# Patient Record
Sex: Male | Born: 1995 | Race: White | Hispanic: No | Marital: Single | Smoking: Current some day smoker
Health system: Southern US, Community
[De-identification: ages and names within clinical notes are randomized; demographics above are authoritative.]

## PROBLEM LIST (undated history)

## (undated) DIAGNOSIS — F419 Anxiety disorder, unspecified: Secondary | ICD-10-CM

## (undated) DIAGNOSIS — F410 Panic disorder [episodic paroxysmal anxiety] without agoraphobia: Secondary | ICD-10-CM

---
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
## 2022-03-21 ENCOUNTER — Emergency Department (HOSPITAL_BASED_OUTPATIENT_CLINIC_OR_DEPARTMENT_OTHER)
Admission: EM | Admit: 2022-03-21 | Discharge: 2022-03-21 | Disposition: A | Discharge status: Home / Self Care | Payer: Self-pay | Attending: Emergency Medicine | Admitting: Emergency Medicine

## 2022-03-21 ENCOUNTER — Other Ambulatory Visit: Payer: Self-pay

## 2022-03-21 ENCOUNTER — Encounter (HOSPITAL_BASED_OUTPATIENT_CLINIC_OR_DEPARTMENT_OTHER): Payer: Self-pay

## 2022-03-21 ENCOUNTER — Emergency Department (HOSPITAL_BASED_OUTPATIENT_CLINIC_OR_DEPARTMENT_OTHER): Payer: Self-pay | Admitting: Radiology

## 2022-03-21 DIAGNOSIS — R002 Palpitations: Secondary | ICD-10-CM

## 2022-03-21 DIAGNOSIS — E876 Hypokalemia: Secondary | ICD-10-CM | POA: Insufficient documentation

## 2022-03-21 DIAGNOSIS — I493 Ventricular premature depolarization: Secondary | ICD-10-CM | POA: Insufficient documentation

## 2022-03-21 HISTORY — DX: Anxiety disorder, unspecified: F41.9

## 2022-03-21 HISTORY — DX: Panic disorder (episodic paroxysmal anxiety): F41.0

## 2022-03-21 LAB — CBC
HCT: 45 % (ref 39.0–52.0)
Hemoglobin: 15.9 g/dL (ref 13.0–17.0)
MCH: 29.2 pg (ref 26.0–34.0)
MCHC: 35.3 g/dL (ref 30.0–36.0)
MCV: 82.7 fL (ref 80.0–100.0)
Platelets: 214 10*3/uL (ref 150–400)
RBC: 5.44 MIL/uL (ref 4.22–5.81)
RDW: 12.5 % (ref 11.5–15.5)
WBC: 10.3 10*3/uL (ref 4.0–10.5)
nRBC: 0 % (ref 0.0–0.2)

## 2022-03-21 LAB — BASIC METABOLIC PANEL
Anion gap: 12 (ref 5–15)
BUN: 12 mg/dL (ref 6–20)
CO2: 25 mmol/L (ref 22–32)
Calcium: 10.1 mg/dL (ref 8.9–10.3)
Chloride: 102 mmol/L (ref 98–111)
Creatinine, Ser: 0.99 mg/dL (ref 0.61–1.24)
GFR, Estimated: >60 mL/min (ref >60)
Glucose, Bld: 108 mg/dL — ABNORMAL HIGH (ref 70–99)
Potassium: 3.1 mmol/L — ABNORMAL LOW (ref 3.5–5.1)
Sodium: 139 mmol/L (ref 135–145)

## 2022-03-21 LAB — TROPONIN I (HIGH SENSITIVITY): Troponin I (High Sensitivity): 12 ng/L (ref <18)

## 2022-03-21 MED ORDER — POTASSIUM CHLORIDE ER 10 MEQ PO TBCR: 10.0000 meq | EXTENDED_RELEASE_TABLET | Freq: Two times a day (BID) | ORAL | 0 refills | Status: DC

## 2022-03-21 MED ORDER — POTASSIUM CHLORIDE CRYS ER 20 MEQ PO TBCR
40.0000 meq | EXTENDED_RELEASE_TABLET | Freq: Once | ORAL | Status: AC
  Administered 2022-03-21: 40 meq via ORAL
  Filled 2022-03-21: qty 2

## 2022-03-21 NOTE — ED Provider Notes (Signed)
? ?[facility]  ?Provider Note ? ?CSN: [id number] ?Arrival date & time: 03/21/22 1928 ? ?History ?Chief Complaint  ?Patient presents with  ? Palpitations  ? ? ?Brian Yoder is a 26 y.o. male with no significant PMH reports 3 days of palpitations like his heart is skipping a beat. Happens every few minutes. He is concerned he is going to have a heart attack or his heart will stop. Some SOB with the palpitations, but otherwise he has been in his usual state of health.  ? ? ?Home Medications ?Prior to Admission medications   ?Medication Sig Start Date End Date Taking? Authorizing Provider  ?potassium chloride (KLOR-CON) 10 MEQ tablet Take 1 tablet (10 mEq total) by mouth 2 (two) times daily for 3 days. 03/21/22 03/24/22 Yes Pollyann Savoy, MD  ? ? ? ?Allergies    ?Patient has no known allergies. ? ? ?Review of Systems   ?Review of Systems ?Please see HPI for pertinent positives and negatives ? ?Physical Exam ?BP (!) 134/91   Pulse (!) 102   Temp 98.2 ?F (36.8 ?C)   Resp 15   Ht 5\' 7"  (1.702 m)   Wt 72.6 kg   SpO2 100%   BMI 25.06 kg/m?  ? ?Physical Exam ?Vitals and nursing note reviewed.  ?Constitutional:   ?   Appearance: Normal appearance.  ?HENT:  ?   Head: Normocephalic and atraumatic.  ?   Nose: Nose normal.  ?   Mouth/Throat:  ?   Mouth: Mucous membranes are moist.  ?Eyes:  ?   Extraocular Movements: Extraocular movements intact.  ?   Conjunctiva/sclera: Conjunctivae normal.  ?Cardiovascular:  ?   Rate and Rhythm: Normal rate.  ?   Comments: Occasional PVC on monitor ?Pulmonary:  ?   Effort: Pulmonary effort is normal.  ?   Breath sounds: Normal breath sounds.  ?Abdominal:  ?   General: Abdomen is flat.  ?   Palpations: Abdomen is soft.  ?   Tenderness: There is no abdominal tenderness.  ?Musculoskeletal:     ?   General: No swelling. Normal range of motion.  ?   Cervical back: Neck supple.  ?Skin: ?   General: Skin is warm and dry.  ?Neurological:  ?   General: No  focal deficit present.  ?   Mental Status: He is alert.  ?Psychiatric:     ?   Mood and Affect: Mood normal.  ? ? ?ED Results / Procedures / Treatments   ?EKG ?EKG Interpretation ? ?Date/Time:  Friday March 21 2022 19:39:43 EDT ?Ventricular Rate:  101 ?PR Interval:  180 ?QRS Duration: 92 ?QT Interval:  336 ?QTC Calculation: 435 ?R Axis:   88 ?Text Interpretation: Sinus tachycardia Otherwise normal ECG No previous ECGs available Confirmed by [id number] (Lorre Nick) on 03/21/2022 9:31:09 PM ? ?Procedures ?Procedures ? ?Medications Ordered in the ED ?Medications  ?potassium chloride SA (KLOR-CON M) CR tablet 40 mEq (40 mEq Oral Given 03/21/22 2315)  ? ? ?Initial Impression and Plan ? Patient here with symptoms consistent with occasional PVCs confirmed on monitor during evaluation. Labs done in triage show normal CBC and Trop. BMP with mild hypokalemia may be contributing. Will replete orally, recommend outpatient Cardiology follow up if symptoms persist. Patient has declined CXR due to cost concerns.  ? ?ED Course  ? ?  ? ? ?MDM Rules/Calculators/A&P ?Medical Decision Making ?Problems Addressed: ?Hypokalemia: acute illness or injury ?Palpitations: acute illness or injury ?PVCs (premature ventricular contractions): acute illness  or injury ? ?Amount and/or Complexity of Data Reviewed ?Labs: ordered. Decision-making details documented in ED Course. ?Radiology: ordered. ?ECG/medicine tests: ordered and independent interpretation performed. Decision-making details documented in ED Course. ? ?Risk ?Prescription drug management. ? ? ? ?Final Clinical Impression(s) / ED Diagnoses ?Final diagnoses:  ?Palpitations  ?PVCs (premature ventricular contractions)  ?Hypokalemia  ? ? ?Rx / DC Orders ?ED Discharge Orders   ? ?      Ordered  ?  Ambulatory referral to Cardiology       ? 03/21/22 2319  ?  potassium chloride (KLOR-CON) 10 MEQ tablet  2 times daily       ? 03/21/22 2320  ? ?  ?  ? ?  ? ?  ?Pollyann Savoy, MD ?03/21/22  2320 ? ?

## 2022-03-21 NOTE — ED Triage Notes (Signed)
Pt report rapid heart beat x3 days. Pt states it comes and goes.  ? ?Does delta-8 sometimes, last time earlier in the week. Hx of panic attacks. ?

## 2022-03-21 NOTE — Progress Notes (Signed)
Pt does not want xray until he sees a doctor first. States he does not think he needs it.  ?

## 2022-05-19 ENCOUNTER — Ambulatory Visit (HOSPITAL_BASED_OUTPATIENT_CLINIC_OR_DEPARTMENT_OTHER): Payer: Self-pay | Admitting: Cardiology

## 2022-05-26 ENCOUNTER — Ambulatory Visit
Admission: EM | Admit: 2022-05-26 | Discharge: 2022-05-26 | Disposition: A | Discharge status: Home / Self Care | Payer: Self-pay | Attending: Nurse Practitioner | Admitting: Nurse Practitioner

## 2022-05-26 ENCOUNTER — Ambulatory Visit (INDEPENDENT_AMBULATORY_CARE_PROVIDER_SITE_OTHER): Payer: Self-pay

## 2022-05-26 ENCOUNTER — Ambulatory Visit (INDEPENDENT_AMBULATORY_CARE_PROVIDER_SITE_OTHER): Admission: EM | Admit: 2022-05-26 | Payer: Self-pay | Source: Home / Self Care

## 2022-05-26 DIAGNOSIS — K5900 Constipation, unspecified: Secondary | ICD-10-CM

## 2022-05-26 DIAGNOSIS — R109 Unspecified abdominal pain: Secondary | ICD-10-CM

## 2022-05-26 LAB — POCT URINALYSIS DIP (MANUAL ENTRY)
Bilirubin, UA: NEGATIVE
Blood, UA: NEGATIVE
Glucose, UA: NEGATIVE mg/dL
Ketones, POC UA: NEGATIVE mg/dL
Leukocytes, UA: NEGATIVE
Nitrite, UA: NEGATIVE
Protein Ur, POC: NEGATIVE mg/dL
Spec Grav, UA: >=1.03 — AB (ref 1.010–1.025)
Urobilinogen, UA: 1 E.U./dL
pH, UA: 6 (ref 5.0–8.0)

## 2022-05-26 MED ORDER — SENNA 8.6 MG PO TABS: 2.0000 | ORAL_TABLET | Freq: Every day | ORAL | 0 refills | Status: AC

## 2022-05-26 MED ORDER — POLYETHYLENE GLYCOL 3350 17 G PO PACK: 17.0000 g | PACK | Freq: Two times a day (BID) | ORAL | 0 refills | Status: AC

## 2022-05-26 NOTE — ED Triage Notes (Signed)
Pt presents with c/o nausea and intermittent episodes of vomiting, has stool sample that he thinks has parasite

## 2022-05-26 NOTE — ED Triage Notes (Signed)
Pt presents with c/o nausea and intermittent episodes of vomiting, has stool sample that he thinks has parasite          

## 2022-05-26 NOTE — Discharge Instructions (Addendum)
Your abdominal x-ray shows moderate stool in your colon, which indicates constipation.  Your urinalysis does not show any signs of infection. Take medication as prescribed. Increase fluids.  You should be drinking at least 6-8 8 ounce glasses of water daily. Increase the fruits and vegetables in your diet to help relieve her symptoms. Follow-up with cardiology as scheduled on 06/02/2022. Go to the emergency department if you develop worsening abdominal pain, fever, chills, nausea or vomiting that you cannot control, or other concerns.

## 2022-05-26 NOTE — ED Provider Notes (Signed)
[facility]    CSN: [id number] Arrival date & time: 05/26/22  1642      History   Chief Complaint No chief complaint on file.   HPI Brian Yoder is a 26 y.o. male.   HPI  Patient presents for abdominal symptoms that been present for the past 2 months.  Patient states he is concerned he has "parasites" in his stool.  Patient states that 2 months ago he was diagnosed with palpitation and was seen in the ER.  He was started on potassium, given 6 tablets, and completed that dose.  Patient states that he continues to experience palpitations that are consistent with his abdominal pain.  Patient states the palpitations radiate into his abdomen when he feels like something is "released".  Patient states that a couple of months ago, he was eating steak outside when a fly landed on the state, he is concerned that the fly laid eggs on the steak prior to him eating the steak.  He states that he has noticed "eggs" in his stool.  He also complains of intermittent nausea.  He states his last bowel movement was this morning, but prior to this morning, it was 2 days ago.  He denies fever, chills, vomiting, diarrhea, or urinary symptoms.  He is scheduled to follow-up with cardiology on 06/02/2022.  Past Medical History:  Diagnosis Date   Anxiety    Panic attack     There are no problems to display for this patient.   No past surgical history on file.   Home Medications    Prior to Admission medications   Medication Sig Start Date End Date Taking? Authorizing Provider  polyethylene glycol (MIRALAX) 17 g packet Take 17 g by mouth 2 (two) times daily. 05/26/22  Yes Alyza Artiaga-Warren, Sadie Haber, NP  senna (SENOKOT) 8.6 MG TABS tablet Take 2 tablets (17.2 mg total) by mouth at bedtime. 05/26/22 06/25/22 Yes Sherilynn Dieu-Warren, Sadie Haber, NP  potassium chloride (KLOR-CON) 10 MEQ tablet Take 1 tablet (10 mEq total) by mouth 2 (two) times daily for 3 days. 03/21/22 03/24/22  Pollyann Savoy,  MD    Family History No family history on file.  Social History Social History   Tobacco Use   Smoking status: Some Days    Types: Cigarettes   Smokeless tobacco: Never  Vaping Use   Vaping Use: Never used  Substance Use Topics   Alcohol use: Yes     Allergies   Patient has no known allergies.   Review of Systems Review of Systems Per HPI  Physical Exam Triage Vital Signs ED Triage Vitals [05/26/22 1657]  Enc Vitals Group     BP 130/87     Pulse Rate (!) 111     Resp 18     Temp 98.7 F (37.1 C)     Temp src      SpO2 98 %     Weight      Height      Head Circumference      Peak Flow      Pain Score      Pain Loc      Pain Edu?      Excl. in GC?    No data found.  Updated Vital Signs BP 130/87   Pulse (!) 111   Temp 98.7 F (37.1 C)   Resp 18   SpO2 98%   Visual Acuity Right Eye Distance:   Left Eye Distance:   Bilateral Distance:  Right Eye Near:   Left Eye Near:    Bilateral Near:     Physical Exam Vitals and nursing note reviewed.  Constitutional:      Appearance: Normal appearance.  HENT:     Head: Normocephalic.  Eyes:     Extraocular Movements: Extraocular movements intact.     Pupils: Pupils are equal, round, and reactive to light.  Cardiovascular:     Rate and Rhythm: Regular rhythm. Tachycardia present.     Pulses: Normal pulses.     Heart sounds: Normal heart sounds.  Pulmonary:     Effort: Pulmonary effort is normal.     Breath sounds: Normal breath sounds.  Abdominal:     General: Bowel sounds are normal.     Palpations: Abdomen is soft.     Tenderness: There is no abdominal tenderness.  Skin:    General: Skin is warm and dry.  Neurological:     General: No focal deficit present.     Mental Status: He is alert and oriented to person, place, and time.  Psychiatric:        Mood and Affect: Mood normal.        Behavior: Behavior normal.      UC Treatments / Results  Labs (all labs ordered are listed, but  only abnormal results are displayed) Labs Reviewed  POCT URINALYSIS DIP (MANUAL ENTRY) - Abnormal; Notable for the following components:      Result Value   Clarity, UA hazy (*)    Spec Grav, UA >=1.030 (*)    All other components within normal limits    EKG   Radiology DG Abd 1 View  Result Date: 05/26/2022 CLINICAL DATA:  Abdominal pain. EXAM: ABDOMEN - 1 VIEW COMPARISON:  None Available. FINDINGS: There is moderate volume stool in the ascending, transverse and descending colon. Moderate volume stool in the rectum. No dilated large or small bowel. No organomegaly. No pathologic calcifications. IMPRESSION: Moderate volume stool throughout the colon.  No bowel obstruction Electronically Signed   By: Suzy Bouchard M.D.   On: 05/26/2022 17:45    Procedures Procedures (including critical care time)  Medications Ordered in UC Medications - No data to display  Initial Impression / Assessment and Plan / UC Course  I have reviewed the triage vital signs and the nursing notes.  Pertinent labs & imaging results that were available during my care of the patient were reviewed by me and considered in my medical decision making (see chart for details).  Patient presents for complaints of the been present for the past 1 to 2 months.  Discussion with patient regarding the likelihood of parasites being in his stool based on his current symptoms and presentation.  X-ray of his abdomen did show moderate stool in his colon and rectum.  The x-ray results are consistent with the symptoms he is currently experiencing.  Urinalysis was negative for urinary tract infection.  Patient was given prescriptions for senna and MiraLAX.  Patient is scheduled to see cardiology on 06/02/2022.  Supportive care recommendations were provided to the patient with strict indications of when to go to the emergency department.  Final Clinical Impressions(s) / UC Diagnoses   Final diagnoses:  Abdominal pain, unspecified  abdominal location  Constipation, unspecified constipation type     Discharge Instructions      Your abdominal x-ray shows moderate stool in your colon, which indicates constipation.  Your urinalysis does not show any signs of infection. Take medication as prescribed. Increase  fluids.  You should be drinking at least 6-8 8 ounce glasses of water daily. Increase the fruits and vegetables in your diet to help relieve her symptoms. Follow-up with cardiology as scheduled on 06/02/2022. Go to the emergency department if you develop worsening abdominal pain, fever, chills, nausea or vomiting that you cannot control, or other concerns.     ED Prescriptions     Medication Sig Dispense Auth. Provider   polyethylene glycol (MIRALAX) 17 g packet Take 17 g by mouth 2 (two) times daily. 24 each Jeralynn Vaquera-Warren, Sadie Haber, NP   senna (SENOKOT) 8.6 MG TABS tablet Take 2 tablets (17.2 mg total) by mouth at bedtime. 60 tablet Kohlton Gilpatrick-Warren, Sadie Haber, NP      PDMP not reviewed this encounter.   Abran Cantor, NP 05/26/22 1755

## 2022-06-02 ENCOUNTER — Ambulatory Visit (INDEPENDENT_AMBULATORY_CARE_PROVIDER_SITE_OTHER): Payer: Self-pay

## 2022-06-02 ENCOUNTER — Ambulatory Visit (INDEPENDENT_AMBULATORY_CARE_PROVIDER_SITE_OTHER): Payer: Self-pay | Admitting: Cardiology

## 2022-06-02 ENCOUNTER — Encounter (HOSPITAL_BASED_OUTPATIENT_CLINIC_OR_DEPARTMENT_OTHER): Payer: Self-pay | Admitting: Cardiology

## 2022-06-02 ENCOUNTER — Other Ambulatory Visit: Payer: Self-pay | Admitting: *Deleted

## 2022-06-02 VITALS — BP 110/80 | HR 83 | Ht 67.0 in | Wt 152.0 lb

## 2022-06-02 DIAGNOSIS — R002 Palpitations: Secondary | ICD-10-CM

## 2022-06-02 DIAGNOSIS — Z0000 Encounter for general adult medical examination without abnormal findings: Secondary | ICD-10-CM

## 2022-06-02 DIAGNOSIS — E876 Hypokalemia: Secondary | ICD-10-CM

## 2022-06-02 DIAGNOSIS — R0602 Shortness of breath: Secondary | ICD-10-CM

## 2022-06-02 NOTE — Assessment & Plan Note (Addendum)
We will check an echocardiogram.  The sensation as well as nausea and vomiting could be related to increasing anxiety surrounding his palpitations inducing a vagal type response.

## 2022-06-03 ENCOUNTER — Telehealth: Payer: Self-pay | Admitting: Licensed Clinical Social Worker

## 2022-06-03 LAB — COMPREHENSIVE METABOLIC PANEL
ALT: 25 IU/L (ref 0–44)
AST: 25 IU/L (ref 0–40)
Albumin/Globulin Ratio: 2.2 (ref 1.2–2.2)
Albumin: 5 g/dL (ref 4.1–5.2)
Alkaline Phosphatase: 86 IU/L (ref 44–121)
BUN/Creatinine Ratio: 9 (ref 9–20)
BUN: 8 mg/dL (ref 6–20)
Bilirubin Total: 1 mg/dL (ref 0.0–1.2)
CO2: 21 mmol/L (ref 20–29)
Calcium: 10 mg/dL (ref 8.7–10.2)
Chloride: 103 mmol/L (ref 96–106)
Creatinine, Ser: 0.94 mg/dL (ref 0.76–1.27)
Globulin, Total: 2.3 g/dL (ref 1.5–4.5)
Glucose: 84 mg/dL (ref 70–99)
Potassium: 3.9 mmol/L (ref 3.5–5.2)
Sodium: 140 mmol/L (ref 134–144)
Total Protein: 7.3 g/dL (ref 6.0–8.5)
eGFR: 115 mL/min/{1.73_m2} (ref >59)

## 2022-06-03 LAB — LIPID PANEL
Chol/HDL Ratio: 5.3 ratio — ABNORMAL HIGH (ref 0.0–5.0)
Cholesterol, Total: 191 mg/dL (ref 100–199)
HDL: 36 mg/dL — ABNORMAL LOW (ref >39)
LDL Chol Calc (NIH): 140 mg/dL — ABNORMAL HIGH (ref 0–99)
Triglycerides: 82 mg/dL (ref 0–149)
VLDL Cholesterol Cal: 15 mg/dL (ref 5–40)

## 2022-06-03 LAB — TSH: TSH: 1.41 u[IU]/mL (ref 0.450–4.500)

## 2022-06-03 NOTE — Progress Notes (Signed)
Heart and Vascular Care Navigation  06/03/2022  Brian Yoder 1996/12/07 [id number]  Reason for Referral:  Uninsured, no PCP Patient is participating in a Managed Medicaid Plan: No, self pay only  Engaged with patient by telephone for initial visit for Heart and Vascular Care Coordination.                                                                                                   Assessment:           LCSW was able to reach pt at [phone]; introduced self, role, reason for call.  Pt confirmed home address, no PCP, and emergency contact remains his mom. Pt resides in a camper on his grandparents property. 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 Pt expresses general financial strain [employer]") but denies any current past due notices or other challenges with costs of living at this time. He is mostly concerned by paying medical bills. We discussed [facility] [employer] and [employer]- how to complete and what documents to gather. Pt agreeable to these being mailed to him. He understands that I am available for assistance moving forward to complete and submit these documents. All questions answered for pt at this time.                           HRT/VAS Care Coordination     Patients Home Cardiology Office --  [facility]   Outpatient Care Team Social Worker   Social Worker Name: Nile Riggs, [facility] [phone]   Living arrangements for the past 2 months Mobile Home  on grandparents property   Lives with: Self   Patient Current Insurance Coverage Self-Pay   Patient Has Concern With Paying Medical Bills Yes   Patient Concerns With Medical Bills ongoing medical work up   Medical Bill Referrals: [employer]   Does Patient Have Prescription Coverage? No       Social History:                                                                              SDOH Screenings   Alcohol Screen: Not on file  Depression (NIO2-7): Not on file  Financial Resource Strain: Medium Risk (06/03/2022)   Overall Financial Resource Strain (CARDIA)    Difficulty of Paying Living Expenses: Somewhat hard  Food Insecurity: Food Insecurity Present (06/03/2022)   Hunger Vital Sign    Worried About Running Out of Food in the Last Year: Sometimes true    Ran Out of Food in the Last Year: Sometimes true  Housing: Low Risk  (06/03/2022)   Housing    Last Housing Risk  Score: 0  Physical Activity: Not on file  Social Connections: Not on file  Stress: Not on file  Tobacco Use: High Risk (06/02/2022)   Patient History    Smoking Tobacco Use: Some Days    Smokeless Tobacco Use: Never    Passive Exposure: Not on file  Transportation Needs: No Transportation Needs (06/03/2022)   PRAPARE - Transportation    Lack of Transportation (Medical): No    Lack of Transportation (Non-Medical): No    SDOH Interventions: Financial Resources:  Financial Strain Interventions: Other (Comment) ([employer]; [employer]) Editor, commissioning for [employer]  Food Insecurity:  Food Insecurity Interventions: Other (Comment) (mailed information on SNAP application)  Housing Insecurity:  Housing Interventions: Intervention Not Indicated  Transportation:   Transportation Interventions: Intervention Not Indicated    Other Care Navigation Interventions:     Provided Pharmacy assistance resources  No current financial concerns w/ medications   Follow-up plan:   Pt mailed my card, [employer] and [employer]. I will f/u with pt in a few weeks to ensure applications received. I encouraged pt to call me before that time should any additional questions/concerns arise.

## 2022-06-05 ENCOUNTER — Encounter: Payer: Self-pay | Admitting: *Deleted

## 2022-06-07 DIAGNOSIS — R002 Palpitations: Secondary | ICD-10-CM

## 2022-06-09 ENCOUNTER — Ambulatory Visit (HOSPITAL_BASED_OUTPATIENT_CLINIC_OR_DEPARTMENT_OTHER): Payer: Self-pay

## 2022-06-09 DIAGNOSIS — R002 Palpitations: Secondary | ICD-10-CM

## 2022-06-09 DIAGNOSIS — R0602 Shortness of breath: Secondary | ICD-10-CM

## 2022-06-09 LAB — ECHOCARDIOGRAM COMPLETE
AR max vel: 2.19 cm2
AV Area VTI: 2.32 cm2
AV Area mean vel: 2.29 cm2
AV Mean grad: 3 mmHg
AV Peak grad: 5.8 mmHg
Ao pk vel: 1.2 m/s
Area-P 1/2: 4.93 cm2
S' Lateral: 2.41 cm

## 2022-06-19 ENCOUNTER — Encounter: Payer: Self-pay | Admitting: *Deleted

## 2022-06-19 ENCOUNTER — Telehealth: Payer: Self-pay | Admitting: Licensed Clinical Social Worker

## 2022-06-19 NOTE — Telephone Encounter (Signed)
H&V Care Navigation CSW Progress Note  Clinical Social Worker contacted patient by phone to f/u on assistance applications. No answer at [phone], phone went to voicemail and I left a message requesting call back. Will re-attempt again as able.   Patient is participating in a Managed Medicaid Plan:  No, self pay only  SDOH Screenings   Alcohol Screen: Not on file  Depression (IEP3-2): Not on file  Financial Resource Strain: Medium Risk (06/03/2022)   Overall Financial Resource Strain (CARDIA)    Difficulty of Paying Living Expenses: Somewhat hard  Food Insecurity: Food Insecurity Present (06/03/2022)   Hunger Vital Sign    Worried About Running Out of Food in the Last Year: Sometimes true    Ran Out of Food in the Last Year: Sometimes true  Housing: Low Risk  (06/03/2022)   Housing    Last Housing Risk Score: 0  Physical Activity: Not on file  Social Connections: Not on file  Stress: Not on file  Tobacco Use: High Risk (06/02/2022)   Patient History    Smoking Tobacco Use: Some Days    Smokeless Tobacco Use: Never    Passive Exposure: Not on file  Transportation Needs: No Transportation Needs (06/03/2022)   PRAPARE - Transportation    Lack of Transportation (Medical): No    Lack of Transportation (Non-Medical): No   Octavio Graves, MSW, LCSW Clinical Social Worker II [facility] Heart/Vascular Care Navigation  [phone]- work cell phone (preferred) [phone]- desk phone

## 2022-06-23 ENCOUNTER — Telehealth: Payer: Self-pay | Admitting: Licensed Clinical Social Worker

## 2022-06-23 NOTE — Telephone Encounter (Signed)
H&V Care Navigation CSW Progress Note  Clinical Social Worker contacted patient by phone to f/u on assistance applications and resources. No answer again at [phone], left 2nd additional message. Will re-attempt a third time as able.   Patient is participating in a Managed Medicaid Plan:  No, self pay only  SDOH Screenings   Alcohol Screen: Not on file  Depression (UXL2-4): Not on file  Financial Resource Strain: Medium Risk (06/03/2022)   Overall Financial Resource Strain (CARDIA)    Difficulty of Paying Living Expenses: Somewhat hard  Food Insecurity: Food Insecurity Present (06/03/2022)   Hunger Vital Sign    Worried About Running Out of Food in the Last Year: Sometimes true    Ran Out of Food in the Last Year: Sometimes true  Housing: Low Risk  (06/03/2022)   Housing    Last Housing Risk Score: 0  Physical Activity: Not on file  Social Connections: Not on file  Stress: Not on file  Tobacco Use: High Risk (06/02/2022)   Patient History    Smoking Tobacco Use: Some Days    Smokeless Tobacco Use: Never    Passive Exposure: Not on file  Transportation Needs: No Transportation Needs (06/03/2022)   PRAPARE - Transportation    Lack of Transportation (Medical): No    Lack of Transportation (Non-Medical): No    Octavio Graves, MSW, LCSW Clinical Social Worker II [facility] Heart/Vascular Care Navigation  [phone]- work cell phone (preferred) [phone]- desk phone

## 2022-06-24 ENCOUNTER — Telehealth: Payer: Self-pay | Admitting: Licensed Clinical Social Worker

## 2022-06-24 NOTE — Telephone Encounter (Signed)
H&V Care Navigation CSW Progress Note  Clinical Social Worker contacted patient by phone to f/u on resources and applications sent. No answer again at [phone]. Left a third voicemail for pt. Will attempt one more time as able.   Patient is participating in a Managed Medicaid Plan:  No, self pay only.   SDOH Screenings   Alcohol Screen: Not on file  Depression (OXB3-5): Not on file  Financial Resource Strain: Medium Risk (06/03/2022)   Overall Financial Resource Strain (CARDIA)    Difficulty of Paying Living Expenses: Somewhat hard  Food Insecurity: Food Insecurity Present (06/03/2022)   Hunger Vital Sign    Worried About Running Out of Food in the Last Year: Sometimes true    Ran Out of Food in the Last Year: Sometimes true  Housing: Low Risk  (06/03/2022)   Housing    Last Housing Risk Score: 0  Physical Activity: Not on file  Social Connections: Not on file  Stress: Not on file  Tobacco Use: High Risk (06/02/2022)   Patient History    Smoking Tobacco Use: Some Days    Smokeless Tobacco Use: Never    Passive Exposure: Not on file  Transportation Needs: No Transportation Needs (06/03/2022)   PRAPARE - Transportation    Lack of Transportation (Medical): No    Lack of Transportation (Non-Medical): No   Octavio Graves, MSW, LCSW Clinical Social Worker II [facility] Heart/Vascular Care Navigation  [phone]- work cell phone (preferred) [phone]- desk phone

## 2022-06-26 ENCOUNTER — Telehealth: Payer: Self-pay | Admitting: Licensed Clinical Social Worker

## 2022-06-26 NOTE — Telephone Encounter (Signed)
H&V Care Navigation CSW Progress Note  Clinical Social Worker contacted patient by phone to f/u on missed call at end of clinic on 7/19. Pt had called but not left voicemail. Additional call and voicemail left this morning.   Patient is participating in a Managed Medicaid Plan:  No, self pay only  SDOH Screenings   Alcohol Screen: Not on file  Depression (ION6-2): Not on file  Financial Resource Strain: Medium Risk (06/03/2022)   Overall Financial Resource Strain (CARDIA)    Difficulty of Paying Living Expenses: Somewhat hard  Food Insecurity: Food Insecurity Present (06/03/2022)   Hunger Vital Sign    Worried About Running Out of Food in the Last Year: Sometimes true    Ran Out of Food in the Last Year: Sometimes true  Housing: Low Risk  (06/03/2022)   Housing    Last Housing Risk Score: 0  Physical Activity: Not on file  Social Connections: Not on file  Stress: Not on file  Tobacco Use: High Risk (06/02/2022)   Patient History    Smoking Tobacco Use: Some Days    Smokeless Tobacco Use: Never    Passive Exposure: Not on file  Transportation Needs: No Transportation Needs (06/03/2022)   PRAPARE - Transportation    Lack of Transportation (Medical): No    Lack of Transportation (Non-Medical): No    Octavio Graves, MSW, LCSW Clinical Social Worker II [facility] Heart/Vascular Care Navigation  [phone]- work cell phone (preferred) [phone]- desk phone

## 2022-07-02 ENCOUNTER — Telehealth: Payer: Self-pay | Admitting: Cardiology

## 2022-07-02 NOTE — Telephone Encounter (Signed)
Patient calling in to say what can be down next since he is still having these problems. Seeing if he can be prescribe cholesterol medication. He states if he cant answer please leave VM. Please advise

## 2022-07-02 NOTE — Telephone Encounter (Signed)
Returned call to patient who states that he is still having issues with dizziness, feeling "out of breath" and palpitations. States he had to miss work two days last week because palpitations were so bad. Now that monitor was benign, EKG was clear, as well as ECHO, he wants to know what can be done. He states "something is wrong and we're doing all these tests, but I don't know what to do or whats wrong." Patient reviewed labs and was thinking that since his LDL was high, he should be started on a cholesterol medication and thinking this would help him. I advised that yes, his cholesterol is high and he should work on diet, but that unless he had a blockage as a result from high cholesterol, it's likely not the cause. Pt seeking advice from [facility] on what to do from here.

## 2022-07-03 MED ORDER — METOPROLOL SUCCINATE ER 25 MG PO TB24: 25.0000 mg | ORAL_TABLET | Freq: Every day | ORAL | 11 refills | Status: AC

## 2022-07-03 NOTE — Telephone Encounter (Signed)
Understand the frustration. Agree with diet and exercise for LDL of 140  Can try Toprol XL 25mg  once a day to see if this helps some of the symptoms of palpitations.  Continue to hydrate well.   , MD

## 2022-07-03 NOTE — Telephone Encounter (Signed)
Called and spoke with patient. He understands to try to get his LDL down and will begin using toprol xl daily for palpitations and let us know if this does not improve his symptoms.

## 2022-07-17 ENCOUNTER — Encounter: Payer: Self-pay | Admitting: Cardiology

## 2022-07-17 DIAGNOSIS — R002 Palpitations: Secondary | ICD-10-CM

## 2022-07-17 NOTE — Telephone Encounter (Signed)
Spoke with pt who states he took Metoprolol x 10 days and developed dizziness so he stopped taking medication.  He has been unable to check his BP as he does not have access to a BP monitor.  He states he has been off Metoprolol for 5 days but continues to be "super dizzy, light headed, nausea, pale, faintish feeling."  He denies CP,SOB or vomiting. Pt advised if current symptoms were side effects of Metoprolol they most likely would have resolved within a couple of days.  Pt reassured his echo and last lab work were WNL.  Encouraged pt to contact his PCP or an Urgent Care for further evaluation. Pt verbalizes understanding and agrees with current plan.  Will forward to Dr Anne Fu to make him aware.

## 2022-07-18 NOTE — Telephone Encounter (Signed)
Please see the MyChart message reply(ies) for my assessment and plan.    This patient gave consent for this Medical Advice Message and is aware that it may result in a bill to their insurance company, as well as the possibility of receiving a bill for a co-payment or deductible. They are an established patient, but are not seeking medical advice exclusively about a problem treated during an in person or video visit in the last seven days. I did not recommend an in person or video visit within seven days of my reply.    I spent a total of 6 minutes cumulative time within 7 days through MyChart messaging.  Bao Coreas, MD   

## 2022-07-25 ENCOUNTER — Ambulatory Visit: Payer: Self-pay | Admitting: Nurse Practitioner

## 2022-08-01 ENCOUNTER — Ambulatory Visit
Admission: EM | Admit: 2022-08-01 | Discharge: 2022-08-01 | Disposition: A | Discharge status: Home / Self Care | Payer: Self-pay | Attending: Family Medicine | Admitting: Family Medicine

## 2022-08-01 ENCOUNTER — Encounter: Payer: Self-pay | Admitting: Emergency Medicine

## 2022-08-01 DIAGNOSIS — R112 Nausea with vomiting, unspecified: Secondary | ICD-10-CM | POA: Insufficient documentation

## 2022-08-01 DIAGNOSIS — N4889 Other specified disorders of penis: Secondary | ICD-10-CM | POA: Insufficient documentation

## 2022-08-01 LAB — POCT URINALYSIS DIP (MANUAL ENTRY)
Blood, UA: NEGATIVE
Glucose, UA: NEGATIVE mg/dL
Leukocytes, UA: NEGATIVE
Nitrite, UA: NEGATIVE
Protein Ur, POC: 30 mg/dL — AB
Spec Grav, UA: >=1.03 — AB (ref 1.010–1.025)
Urobilinogen, UA: 0.2 E.U./dL
pH, UA: 5.5 (ref 5.0–8.0)

## 2022-08-01 MED ORDER — ONDANSETRON 4 MG PO TBDP
4.0000 mg | ORAL_TABLET | Freq: Once | ORAL | Status: AC
  Administered 2022-08-01: 4 mg via ORAL

## 2022-08-01 MED ORDER — ONDANSETRON 4 MG PO TBDP: 4.0000 mg | ORAL_TABLET | Freq: Three times a day (TID) | ORAL | 0 refills | Status: AC | PRN

## 2022-08-01 NOTE — ED Triage Notes (Signed)
Nausea and vomiting x 3 days.  States he has burning at the top of his penis that started last night.

## 2022-08-01 NOTE — ED Provider Notes (Signed)
[facility]    CSN: [id number] Arrival date & time: 08/01/22  1845      History   Chief Complaint No chief complaint on file.   HPI Brian Yoder is a 26 y.o. male.   Presenting today with 3-day history of nausea, vomiting, mild abdominal discomfort and bloating.  Denies fever, chills, body aches, upper respiratory symptoms, urinary symptoms.  He is also having some burning that he describes to be in his urethral region.  Does not necessarily seem to be associated with urination.  Denies penile discharge, rashes, lesions, pelvic or abdominal pain, known exposures to STDs.  States he is got a monogamous sexual partner for the past 2 years.  So far not trying anything over-the-counter for his symptoms.    Past Medical History:  Diagnosis Date   Anxiety    Panic attack     Patient Active Problem List   Diagnosis Date Noted   Shortness of breath 06/02/2022   Palpitations 06/02/2022    History reviewed. No pertinent surgical history.     Home Medications    Prior to Admission medications   Medication Sig Start Date End Date Taking? Authorizing Provider  ondansetron (ZOFRAN-ODT) 4 MG disintegrating tablet Take 1 tablet (4 mg total) by mouth every 8 (eight) hours as needed for nausea or vomiting. 08/01/22  Yes Particia Nearing, PA-C  metoprolol succinate (TOPROL XL) 25 MG 24 hr tablet Take 1 tablet (25 mg total) by mouth daily. 07/03/22   Jake Bathe, MD  polyethylene glycol (MIRALAX) 17 g packet Take 17 g by mouth 2 (two) times daily. 05/26/22   Leath-Warren, Sadie Haber, NP    Family History History reviewed. No pertinent family history.  Social History Social History   Tobacco Use   Smoking status: Some Days    Types: Cigarettes   Smokeless tobacco: Never  Vaping Use   Vaping Use: Never used  Substance Use Topics   Alcohol use: Yes     Allergies   Patient has no known allergies.   Review of Systems Review of Systems Per  HPI  Physical Exam Triage Vital Signs ED Triage Vitals  Enc Vitals Group     BP 08/01/22 1901 121/76     Pulse Rate 08/01/22 1901 99     Resp 08/01/22 1901 18     Temp 08/01/22 1901 98 F (36.7 C)     Temp Source 08/01/22 1901 Oral     SpO2 08/01/22 1901 98 %     Weight --      Height --      Head Circumference --      Peak Flow --      Pain Score 08/01/22 1902 0     Pain Loc --      Pain Edu? --      Excl. in GC? --    No data found.  Updated Vital Signs BP 121/76 (BP Location: Right Arm)   Pulse 99   Temp 98 F (36.7 C) (Oral)   Resp 18   SpO2 98%   Visual Acuity Right Eye Distance:   Left Eye Distance:   Bilateral Distance:    Right Eye Near:   Left Eye Near:    Bilateral Near:     Physical Exam Vitals and nursing note reviewed.  Constitutional:      Appearance: Normal appearance.  HENT:     Head: Atraumatic.  Eyes:     Extraocular Movements: Extraocular movements intact.  Conjunctiva/sclera: Conjunctivae normal.  Cardiovascular:     Rate and Rhythm: Normal rate and regular rhythm.  Pulmonary:     Effort: Pulmonary effort is normal.     Breath sounds: Normal breath sounds.  Abdominal:     General: Bowel sounds are normal. There is no distension.     Palpations: Abdomen is soft.     Tenderness: There is no abdominal tenderness. There is no right CVA tenderness, left CVA tenderness or guarding.  Genitourinary:    Comments: GU exam declined.  Self swab performed Musculoskeletal:        General: Normal range of motion.     Cervical back: Normal range of motion and neck supple.  Skin:    General: Skin is warm and dry.  Neurological:     General: No focal deficit present.     Mental Status: He is oriented to person, place, and time.  Psychiatric:        Mood and Affect: Mood normal.        Thought Content: Thought content normal.        Judgment: Judgment normal.      UC Treatments / Results  Labs (all labs ordered are listed, but only  abnormal results are displayed) Labs Reviewed  POCT URINALYSIS DIP (MANUAL ENTRY) - Abnormal; Notable for the following components:      Result Value   Color, UA straw (*)    Bilirubin, UA moderate (*)    Ketones, POC UA >= (160) (*)    Spec Grav, UA >=1.030 (*)    Protein Ur, POC =30 (*)    All other components within normal limits  CYTOLOGY, (ORAL, ANAL, URETHRAL) ANCILLARY ONLY    EKG   Radiology No results found.  Procedures Procedures (including critical care time)  Medications Ordered in UC Medications  ondansetron (ZOFRAN-ODT) disintegrating tablet 4 mg (4 mg Oral Given 08/01/22 1938)    Initial Impression / Assessment and Plan / UC Course  I have reviewed the triage vital signs and the nursing notes.  Pertinent labs & imaging results that were available during my care of the patient were reviewed by me and considered in my medical decision making (see chart for details).     Zofran given in triage for nausea, urinalysis showing some evidence of dehydration but otherwise no signs of infection.  Cytology swab pending, treat patient on these results.  Discussed increasing fluid intake, Zofran as needed.  Work note given.  Return for any worsening symptoms.  Suspect viral GI illness.  Final Clinical Impressions(s) / UC Diagnoses   Final diagnoses:  Penile irritation  Nausea and vomiting, unspecified vomiting type   Discharge Instructions   None    ED Prescriptions     Medication Sig Dispense Auth. Provider   ondansetron (ZOFRAN-ODT) 4 MG disintegrating tablet Take 1 tablet (4 mg total) by mouth every 8 (eight) hours as needed for nausea or vomiting. 20 tablet Particia Nearing, [place]      PDMP not reviewed this encounter.   Particia Nearing, [place] 08/01/22 1958

## 2022-08-04 LAB — CYTOLOGY, (ORAL, ANAL, URETHRAL) ANCILLARY ONLY
Chlamydia: NEGATIVE
Comment: NEGATIVE
Comment: NEGATIVE
Comment: NORMAL
Neisseria Gonorrhea: NEGATIVE
Trichomonas: NEGATIVE

## 2022-08-19 ENCOUNTER — Ambulatory Visit: Payer: Self-pay | Admitting: Nurse Practitioner

## 2023-05-30 IMAGING — DX DG ABDOMEN 1V
1 series · 1 of 1 positions shown · non-contrast
Comparison: None Available.

CLINICAL DATA: Abdominal pain.

EXAM:
ABDOMEN - 1 VIEW

[abdomen supine ap]
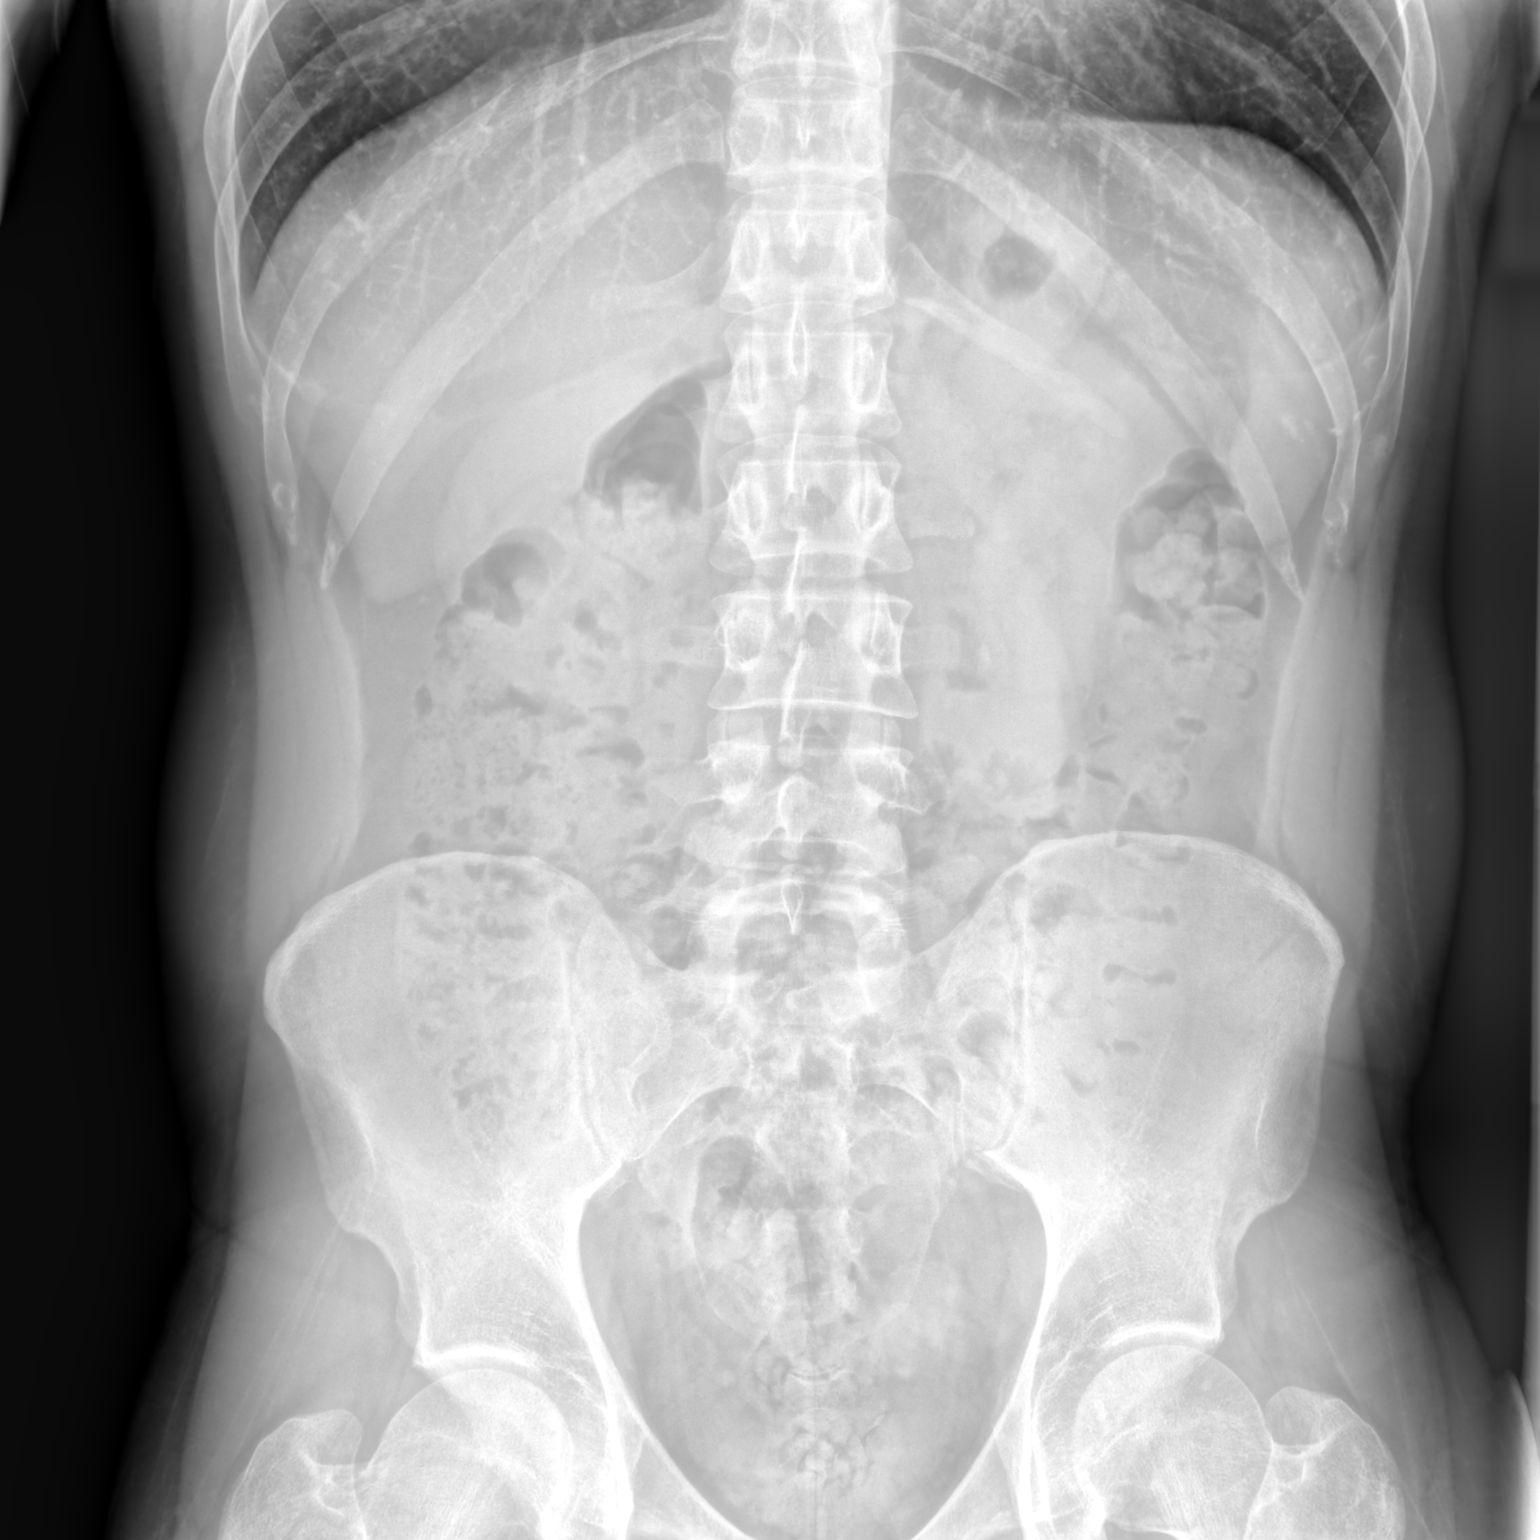

[1 of 1 positions shown; findings below may reference images not displayed]

FINDINGS: There is moderate volume stool in the ascending, transverse and
descending colon. Moderate volume stool in the rectum. No dilated
large or small bowel. No organomegaly. No pathologic calcifications.
IMPRESSION: Moderate volume stool throughout the colon.  No bowel obstruction

## 2024-08-02 ENCOUNTER — Emergency Department
Admission: EM | Admit: 2024-08-02 | Discharge: 2024-08-02 | Disposition: A | Discharge status: Home / Self Care | Payer: Self-pay | Attending: Emergency Medicine | Admitting: Emergency Medicine

## 2024-08-02 ENCOUNTER — Emergency Department: Payer: Self-pay

## 2024-08-02 ENCOUNTER — Other Ambulatory Visit: Payer: Self-pay

## 2024-08-02 DIAGNOSIS — J40 Bronchitis, not specified as acute or chronic: Secondary | ICD-10-CM | POA: Insufficient documentation

## 2024-08-02 DIAGNOSIS — E860 Dehydration: Secondary | ICD-10-CM | POA: Insufficient documentation

## 2024-08-02 DIAGNOSIS — R112 Nausea with vomiting, unspecified: Secondary | ICD-10-CM

## 2024-08-02 LAB — CBC
HCT: 50.4 % (ref 39.0–52.0)
Hemoglobin: 17.9 g/dL — ABNORMAL HIGH (ref 13.0–17.0)
MCH: 30.2 pg (ref 26.0–34.0)
MCHC: 35.5 g/dL (ref 30.0–36.0)
MCV: 85.1 fL (ref 80.0–100.0)
Platelets: 292 K/uL (ref 150–400)
RBC: 5.92 MIL/uL — ABNORMAL HIGH (ref 4.22–5.81)
RDW: 12.4 % (ref 11.5–15.5)
WBC: 8.5 K/uL (ref 4.0–10.5)
nRBC: 0 % (ref 0.0–0.2)

## 2024-08-02 LAB — COMPREHENSIVE METABOLIC PANEL WITH GFR
ALT: 34 U/L (ref 0–44)
AST: 32 U/L (ref 15–41)
Albumin: 4.8 g/dL (ref 3.5–5.0)
Alkaline Phosphatase: 62 U/L (ref 38–126)
Anion gap: 17 — ABNORMAL HIGH (ref 5–15)
BUN: 14 mg/dL (ref 6–20)
CO2: 19 mmol/L — ABNORMAL LOW (ref 22–32)
Calcium: 9.9 mg/dL (ref 8.9–10.3)
Chloride: 102 mmol/L (ref 98–111)
Creatinine, Ser: 0.91 mg/dL (ref 0.61–1.24)
GFR, Estimated: >60 mL/min (ref >60)
Glucose, Bld: 102 mg/dL — ABNORMAL HIGH (ref 70–99)
Potassium: 3.4 mmol/L — ABNORMAL LOW (ref 3.5–5.1)
Sodium: 138 mmol/L (ref 135–145)
Total Bilirubin: 1.6 mg/dL — ABNORMAL HIGH (ref 0.0–1.2)
Total Protein: 8.2 g/dL — ABNORMAL HIGH (ref 6.5–8.1)

## 2024-08-02 LAB — RESP PANEL BY RT-PCR (RSV, FLU A&B, COVID)  RVPGX2
Influenza A by PCR: NEGATIVE
Influenza B by PCR: NEGATIVE
Resp Syncytial Virus by PCR: NEGATIVE
SARS Coronavirus 2 by RT PCR: NEGATIVE

## 2024-08-02 LAB — LIPASE, BLOOD: Lipase: 38 U/L (ref 11–51)

## 2024-08-02 MED ORDER — BENZONATATE 100 MG PO CAPS: 100.0000 mg | ORAL_CAPSULE | Freq: Three times a day (TID) | ORAL | 0 refills | Status: AC | PRN

## 2024-08-02 MED ORDER — IPRATROPIUM-ALBUTEROL 0.5-2.5 (3) MG/3ML IN SOLN
3.0000 mL | Freq: Once | RESPIRATORY_TRACT | Status: AC
  Administered 2024-08-02: 3 mL via RESPIRATORY_TRACT
  Filled 2024-08-02: qty 3

## 2024-08-02 MED ORDER — ALBUTEROL SULFATE HFA 108 (90 BASE) MCG/ACT IN AERS: 2.0000 | INHALATION_SPRAY | Freq: Four times a day (QID) | RESPIRATORY_TRACT | 0 refills | Status: AC | PRN

## 2024-08-02 MED ORDER — POTASSIUM CHLORIDE CRYS ER 20 MEQ PO TBCR
20.0000 meq | EXTENDED_RELEASE_TABLET | Freq: Once | ORAL | Status: AC
  Administered 2024-08-02: 20 meq via ORAL
  Filled 2024-08-02: qty 1

## 2024-08-02 MED ORDER — ONDANSETRON HCL 4 MG/2ML IJ SOLN
4.0000 mg | Freq: Once | INTRAMUSCULAR | Status: AC
  Administered 2024-08-02: 4 mg via INTRAVENOUS
  Filled 2024-08-02: qty 2

## 2024-08-02 MED ORDER — LACTATED RINGERS IV BOLUS
1000.0000 mL | Freq: Once | INTRAVENOUS | Status: AC
  Administered 2024-08-02: 1000 mL via INTRAVENOUS

## 2024-08-02 NOTE — ED Triage Notes (Signed)
 Patient states cough and weakness for a few weeks; recently treated for sinus infection with Amoxicillin but symptoms not improving. States he has had two negative Covid tests at home.

## 2024-08-02 NOTE — ED Provider Notes (Signed)
 [facility] Provider Note    Event Date/Time   First MD Initiated Contact with Patient 08/02/24 1645     (approximate)   History   Chief Complaint Weakness   HPI  Brian Yoder is a 28 y.o. male with past medical history of anxiety who presents to the ED complaining of weakness.  Patient reports that he has had 2 weeks of productive cough and congestion with generalized weakness.  He states that over the past 7 days he has had nausea and occasional vomiting as well as diarrhea.  He denies any pain in his abdomen and has not had any fevers, dysuria, or flank pain.  He denies any pain in his chest or difficulty breathing.  He was prescribed a course of amoxicillin for possible sinus infection, but has had no improvement in his symptoms.     Physical Exam   Triage Vital Signs: ED Triage Vitals  Encounter Vitals Group     BP 08/02/24 1606 115/86     Girls Systolic BP Percentile --      Girls Diastolic BP Percentile --      Boys Systolic BP Percentile --      Boys Diastolic BP Percentile --      Pulse Rate 08/02/24 1606 (!) 109     Resp 08/02/24 1606 20     Temp 08/02/24 1606 97.6 F (36.4 C)     Temp Source 08/02/24 1606 Oral     SpO2 08/02/24 1606 99 %     Weight 08/02/24 1608 151 lb (68.5 kg)     Height 08/02/24 1608 5' 7 (1.702 m)     Head Circumference --      Peak Flow --      Pain Score 08/02/24 1608 0     Pain Loc --      Pain Education --      Exclude from Growth Chart --     Most recent vital signs: Vitals:   08/02/24 1606 08/02/24 1831  BP: 115/86   Pulse: (!) 109   Resp: 20   Temp: 97.6 F (36.4 C)   SpO2: 99% 99%    Constitutional: Alert and oriented. Eyes: Conjunctivae are normal. Head: Atraumatic. Nose: No congestion/rhinnorhea. Mouth/Throat: Mucous membranes are dry. Cardiovascular: Tachycardic, regular rhythm. Grossly normal heart sounds.  2+ radial pulses bilaterally. Respiratory: Normal respiratory effort.   No retractions. Lungs CTAB. Gastrointestinal: Soft and nontender. No distention. Musculoskeletal: No lower extremity tenderness nor edema.  Neurologic:  Normal speech and language. No gross focal neurologic deficits are appreciated.    ED Results / Procedures / Treatments   Labs (all labs ordered are listed, but only abnormal results are displayed) Labs Reviewed  COMPREHENSIVE METABOLIC PANEL WITH GFR - Abnormal; Notable for the following components:      Result Value   Potassium 3.4 (*)    CO2 19 (*)    Glucose, Bld 102 (*)    Total Protein 8.2 (*)    Total Bilirubin 1.6 (*)    Anion gap 17 (*)    All other components within normal limits  CBC - Abnormal; Notable for the following components:   RBC 5.92 (*)    Hemoglobin 17.9 (*)    All other components within normal limits  RESP PANEL BY RT-PCR (RSV, FLU A&B, COVID)  RVPGX2  LIPASE, BLOOD  URINALYSIS, ROUTINE W REFLEX MICROSCOPIC     EKG  ED ECG REPORT I, Carlin Palin, the attending physician, personally  viewed and interpreted this ECG.   Date: 08/02/2024  EKG Time: 16:09  Rate: 90  Rhythm: normal sinus rhythm  Axis: RAD  Intervals:none  ST&T Change: None  RADIOLOGY Chest x-ray reviewed and interpreted by me with no infiltrate, edema, or effusion.  PROCEDURES:  Critical Care performed: No  Procedures   MEDICATIONS ORDERED IN ED: Medications  ipratropium-albuterol  (DUONEB) 0.5-2.5 (3) MG/3ML nebulizer solution 3 mL (3 mLs Nebulization Given 08/02/24 1751)  lactated ringers  bolus 1,000 mL (1,000 mLs Intravenous New Bag/Given 08/02/24 1750)  ondansetron  (ZOFRAN ) injection 4 mg (4 mg Intravenous Given 08/02/24 1750)  potassium chloride  SA (KLOR-CON  M) CR tablet 20 mEq (20 mEq Oral Given 08/02/24 1826)     IMPRESSION / MDM / ASSESSMENT AND PLAN / ED COURSE  I reviewed the triage vital signs and the nursing notes.                              28 y.o. male with past medical history of anxiety who presents  to the ED with 2 weeks of cough and congestion, now with 1 week of nausea, vomiting, and diarrhea.  Patient's presentation is most consistent with acute presentation with potential threat to life or bodily function.  Differential diagnosis includes, but is not limited to, gastroenteritis, dehydration, electrolyte abnormality, AKI, hepatitis, pancreatitis, UTI, pneumonia, bronchitis, COVID-19, influenza.  Patient nontoxic-appearing and in no acute distress, vital signs workable for tachycardia but otherwise reassuring.  EKG shows no evidence of arrhythmia or ischemia and I doubt cardiac etiology for his symptoms.  No significant leukocytosis noted and no findings concerning for sepsis, but patient does have some elevation in hemoglobin consistent with dehydration and hemoconcentration.  No significant electrolyte abnormality or AKI noted, patient does have a mild anion gap acidosis with no signs of DKA.  Suspect this is also due to dehydration and will give IV fluid bolus as well as IV Zofran .  LFTs remarkable for mild elevation in bilirubin with no symptoms to suggest biliary obstruction.  Chest x-ray is unremarkable, COVID and flu testing is negative.  No symptoms to suggest UTI and patient tolerating oral intake without difficulty following Zofran  and fluids.  He is appropriate for discharge home, referral provided to establish PCP and he was advised to have labs rechecked.  He declined nausea medication, but will prescribe albuterol  and Tessalon  for cough.  He was counseled to return to the ED for new or worsening symptoms, patient agrees with plan.      FINAL CLINICAL IMPRESSION(S) / ED DIAGNOSES   Final diagnoses:  Bronchitis  Nausea and vomiting, unspecified vomiting type  Dehydration     Rx / DC Orders   ED Discharge Orders          Ordered    Ambulatory Referral to Primary Care (Establish Care)        08/02/24 1900    albuterol  (VENTOLIN  HFA) 108 (90 Base) MCG/ACT inhaler  Every  6 hours PRN       Note to Pharmacy: Please supply with spacer   08/02/24 1900    benzonatate  (TESSALON  PERLES) 100 MG capsule  3 times daily PRN        08/02/24 1900             Note:  This document was prepared using Dragon voice recognition software and may include unintentional dictation errors.   Willo Dunnings, MD 08/02/24 RETHA
